# Patient Record
Sex: Female | Born: 1970 | Race: White | Hispanic: No | Marital: Married | Smoking: Never smoker
Health system: Southern US, Community
[De-identification: ages and names within clinical notes are randomized; demographics above are authoritative.]

## PROBLEM LIST (undated history)

## (undated) DIAGNOSIS — F419 Anxiety disorder, unspecified: Secondary | ICD-10-CM

## (undated) DIAGNOSIS — Q250 Patent ductus arteriosus: Secondary | ICD-10-CM

## (undated) HISTORY — PX: NO PAST SURGERIES: SHX2092

---

## 2009-01-18 ENCOUNTER — Ambulatory Visit: Payer: Self-pay | Admitting: Internal Medicine

## 2009-08-11 ENCOUNTER — Ambulatory Visit: Payer: Self-pay | Admitting: Family Medicine

## 2011-02-23 ENCOUNTER — Ambulatory Visit: Payer: Self-pay

## 2014-07-24 ENCOUNTER — Ambulatory Visit: Payer: Self-pay | Admitting: Family Medicine

## 2014-07-27 ENCOUNTER — Ambulatory Visit: Payer: Self-pay | Admitting: Family Medicine

## 2014-07-30 ENCOUNTER — Ambulatory Visit: Payer: Self-pay | Admitting: Physician Assistant

## 2014-12-17 ENCOUNTER — Other Ambulatory Visit: Payer: Self-pay | Admitting: Family Medicine

## 2014-12-17 DIAGNOSIS — Z1231 Encounter for screening mammogram for malignant neoplasm of breast: Secondary | ICD-10-CM

## 2014-12-24 ENCOUNTER — Ambulatory Visit: Payer: Self-pay

## 2015-01-02 ENCOUNTER — Ambulatory Visit
Admission: RE | Admit: 2015-01-02 | Discharge: 2015-01-02 | Disposition: A | Discharge status: Home / Self Care | Payer: Managed Care, Other (non HMO) | Source: Ambulatory Visit | Attending: Family Medicine | Admitting: Family Medicine

## 2015-01-02 DIAGNOSIS — Z1231 Encounter for screening mammogram for malignant neoplasm of breast: Secondary | ICD-10-CM | POA: Insufficient documentation

## 2015-03-13 ENCOUNTER — Encounter: Payer: Self-pay | Admitting: Emergency Medicine

## 2015-03-13 ENCOUNTER — Ambulatory Visit
Admission: EM | Admit: 2015-03-13 | Discharge: 2015-03-13 | Disposition: A | Discharge status: Home / Self Care | Payer: Managed Care, Other (non HMO) | Attending: Family Medicine | Admitting: Family Medicine

## 2015-03-13 DIAGNOSIS — T148XXA Other injury of unspecified body region, initial encounter: Secondary | ICD-10-CM

## 2015-03-13 DIAGNOSIS — T148 Other injury of unspecified body region: Secondary | ICD-10-CM | POA: Diagnosis not present

## 2015-03-13 MED ORDER — CYCLOBENZAPRINE HCL 5 MG PO TABS: 5.0000 mg | ORAL_TABLET | Freq: Three times a day (TID) | ORAL | Status: DC | PRN

## 2015-03-13 MED ORDER — IBUPROFEN 800 MG PO TABS: 800.0000 mg | ORAL_TABLET | Freq: Three times a day (TID) | ORAL | Status: DC | PRN
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
## 2016-09-01 ENCOUNTER — Other Ambulatory Visit: Payer: Self-pay | Admitting: Family Medicine

## 2016-09-01 DIAGNOSIS — Z1239 Encounter for other screening for malignant neoplasm of breast: Secondary | ICD-10-CM

## 2017-07-23 ENCOUNTER — Ambulatory Visit
Admission: EM | Admit: 2017-07-23 | Discharge: 2017-07-23 | Disposition: A | Discharge status: Home / Self Care | Payer: Managed Care, Other (non HMO) | Attending: Emergency Medicine | Admitting: Emergency Medicine

## 2017-07-23 ENCOUNTER — Other Ambulatory Visit: Payer: Self-pay

## 2017-07-23 DIAGNOSIS — J019 Acute sinusitis, unspecified: Secondary | ICD-10-CM

## 2017-07-23 MED ORDER — IBUPROFEN 600 MG PO TABS: 600.0000 mg | ORAL_TABLET | Freq: Four times a day (QID) | ORAL | 0 refills | Status: DC | PRN

## 2017-07-23 MED ORDER — FLUTICASONE PROPIONATE 50 MCG/ACT NA SUSP: 2.0000 | Freq: Every day | NASAL | 0 refills | Status: DC

## 2017-07-23 MED ORDER — AMOXICILLIN-POT CLAVULANATE 875-125 MG PO TABS: 1.0000 | ORAL_TABLET | Freq: Two times a day (BID) | ORAL | 0 refills | Status: DC

## 2017-07-23 NOTE — ED Triage Notes (Signed)
Patient complains of cough and congestion with fever that started one week ago. Patient reports that symptoms worsened on Wednesday and have remained constant.

## 2017-07-23 NOTE — Discharge Instructions (Signed)
continue the phenylephrine , start regular Mucinex, saline nasal irrigation with a Lloyd HugerNeil med sinus rinse as often as you want, Flonase, Augmentin, ibuprofen 600 mg to take with 1 g of Tylenol 3-4 times a day as needed.  follow-up with your primary care physician as needed.

## 2017-07-23 NOTE — ED Provider Notes (Signed)
HPI  SUBJECTIVE:  Madison Preston is a 47 y.o. female who presents with nasal congestion which is now getting thick, headache, fevers starting yesterday.  T-max 100.  Has been sick for 6 days.  She reports sinus pain and pressure, states her upper teeth hurt.  She reports bilateral ear popping but denies ear pain, change in hearing, otorrhea.  She reports a sore throat secondary to postnasal drip.  She reports a cough productive of the same material as her nasal congestion, chest congestion and states that she is wheezing in the morning.  States that she is sleeping at night okay without waking up coughing.  She denies shortness of breath, chest pain, dyspnea on exertion.  She tried Sudafed PE, ibuprofen 400 mg every 6 hours with improvement of symptoms.  Symptoms are worse with bending forward, lying down.  She has a past medical history of seasonal allergies, PFO, anxiety.  No history of diabetes, hypertension, pulmonary disease.  She is not a smoker.  She has an IUD and denies the possibility of being pregnant.  PMD: Dr. Rivka SaferBraunstein.   History reviewed. No pertinent past medical history.  Past Surgical History:  Procedure Laterality Date  . NO PAST SURGERIES      Family History  Problem Relation Age of Onset  . Breast cancer Mother   . Diabetes Father     Social History   Tobacco Use  . Smoking status: Never Smoker  . Smokeless tobacco: Never Used  Substance Use Topics  . Alcohol use: Yes    Alcohol/week: 0.6 oz    Types: 1 Glasses of wine per week    Comment: rare  . Drug use: No    No current facility-administered medications for this encounter.   Current Outpatient Medications:  .  aspirin 81 MG tablet, Take 81 mg by mouth daily., Disp: , Rfl:  .  sertraline (ZOLOFT) 100 MG tablet, Take 100 mg by mouth daily., Disp: , Rfl:  .  amoxicillin-clavulanate (AUGMENTIN) 875-125 MG tablet, Take 1 tablet by mouth 2 (two) times daily. X 7 days, Disp: 14 tablet, Rfl: 0 .   fluticasone (FLONASE) 50 MCG/ACT nasal spray, Place 2 sprays into both nostrils daily., Disp: 16 g, Rfl: 0 .  ibuprofen (ADVIL,MOTRIN) 600 MG tablet, Take 1 tablet (600 mg total) by mouth every 6 (six) hours as needed., Disp: 30 tablet, Rfl: 0  Allergies  Allergen Reactions  . Sulfur      ROS  As noted in HPI.   Physical Exam  BP 133/89 (BP Location: Left Arm)   Pulse (!) 107   Temp 99.2 F (37.3 C) (Oral) Comment: ibuprofen taken at 7am  Resp 18   Ht 5\' 1"  (1.549 m)   Wt 230 lb (104.3 kg)   SpO2 96%   BMI 43.46 kg/m   Constitutional: Well developed, well nourished, no acute distress Eyes:  EOMI, conjunctiva normal bilaterally HENT: Normocephalic, atraumatic,mucus membranes moist.  TMs normal bilaterally.  Positive purulent nasal congestion.  Erythematous, swollen turbinates.  No maxillary or frontal sinus tenderness.  Normal oropharynx.  Uvula midline. Respiratory: Normal inspiratory effort, lungs clear bilaterally  cardiovascular: Mild tachycardia GI: nondistended skin: No rash, skin intact Musculoskeletal: no deformities Neurologic: Alert & oriented x 3, no focal neuro deficits Psychiatric: Speech and behavior appropriate   ED Course   Medications - No data to display  No orders of the defined types were placed in this encounter.   No results found for this or any previous  visit (from the past 24 hour(s)). No results found.  ED Clinical Impression  Acute non-recurrent sinusitis, unspecified location   ED Assessment/Plan  Presentation consistent with a sinusitis, given the fact that she is starting to have fevers 6 days into the illness, concern for secondary bacterial infection.  She will continue the phenylephrine as she states that the Sudafed gives her palpitations, start regular Mucinex, saline nasal irrigation with a Lloyd Huger med sinus rinse, Flonase, Augmentin, ibuprofen 600 mg to take with 1 g of Tylenol 3-4 times a day as needed.  She will follow-up with  her primary care physician as needed.   Meds ordered this encounter  Medications  . fluticasone (FLONASE) 50 MCG/ACT nasal spray    Sig: Place 2 sprays into both nostrils daily.    Dispense:  16 g    Refill:  0  . amoxicillin-clavulanate (AUGMENTIN) 875-125 MG tablet    Sig: Take 1 tablet by mouth 2 (two) times daily. X 7 days    Dispense:  14 tablet    Refill:  0  . ibuprofen (ADVIL,MOTRIN) 600 MG tablet    Sig: Take 1 tablet (600 mg total) by mouth every 6 (six) hours as needed.    Dispense:  30 tablet    Refill:  0    *This clinic note was created using Scientist, clinical (histocompatibility and immunogenetics). Therefore, there may be occasional mistakes despite careful proofreading.   ?   Domenick Gong, MD 07/23/17 2024

## 2017-08-09 ENCOUNTER — Encounter: Payer: Self-pay | Admitting: *Deleted

## 2017-08-09 ENCOUNTER — Ambulatory Visit (INDEPENDENT_AMBULATORY_CARE_PROVIDER_SITE_OTHER): Payer: Managed Care, Other (non HMO)

## 2017-08-09 ENCOUNTER — Ambulatory Visit
Admission: EM | Admit: 2017-08-09 | Discharge: 2017-08-09 | Disposition: A | Discharge status: Home / Self Care | Payer: Managed Care, Other (non HMO) | Attending: Family Medicine | Admitting: Family Medicine

## 2017-08-09 DIAGNOSIS — J111 Influenza due to unidentified influenza virus with other respiratory manifestations: Secondary | ICD-10-CM | POA: Diagnosis not present

## 2017-08-09 HISTORY — DX: Patent ductus arteriosus: Q25.0

## 2017-08-09 LAB — RAPID STREP SCREEN (MED CTR MEBANE ONLY): STREPTOCOCCUS, GROUP A SCREEN (DIRECT): NEGATIVE

## 2017-08-09 LAB — RAPID INFLUENZA A&B ANTIGENS (ARMC ONLY): INFLUENZA B (ARMC): NEGATIVE

## 2017-08-09 LAB — RAPID INFLUENZA A&B ANTIGENS: Influenza A (ARMC): POSITIVE — AB

## 2017-08-09 MED ORDER — HYDROCOD POLST-CPM POLST ER 10-8 MG/5ML PO SUER: 5.0000 mL | Freq: Two times a day (BID) | ORAL | 0 refills | Status: DC | PRN

## 2017-08-09 MED ORDER — ACETAMINOPHEN 500 MG PO TABS
1000.0000 mg | ORAL_TABLET | Freq: Once | ORAL | Status: AC
  Administered 2017-08-09: 1000 mg via ORAL

## 2017-08-09 MED ORDER — OSELTAMIVIR PHOSPHATE 75 MG PO CAPS: 75.0000 mg | ORAL_CAPSULE | Freq: Two times a day (BID) | ORAL | 0 refills | Status: DC

## 2017-08-09 NOTE — ED Provider Notes (Signed)
[facility]    CSN: [id number] Arrival date & time: 08/09/17  0808  History   Chief Complaint Chief Complaint  Patient presents with  . Cough  . Fever  . Sore Throat   HPI  47 year old female presents with the above complaints.  Patient states that she has not been feeling well since she was diagnosed with a sinus infection on 15 February.  She states that she is completed her course of antibiotic.  She continues to feel not feel well.  On Friday she developed severe cough.  Saturday developed body aches and yesterday developed fever.  She is febrile today.  Her predominant concern is the cough which she states is very severe.  Cough is productive of yellow sputum.  She reports associated sore throat.  No known exacerbating or relieving factors.  No other associated symptoms.  No other complaints.  Past Medical History:  Diagnosis Date  . Patent ductus arteriosus    Past Surgical History:  Procedure Laterality Date  . NO PAST SURGERIES      OB History    No data available     Home Medications    Prior to Admission medications   Medication Sig Start Date End Date Taking? Authorizing Provider  aspirin 81 MG tablet Take 81 mg by mouth daily.   Yes [provider]  fluticasone (FLONASE) 50 MCG/ACT nasal spray Place 2 sprays into both nostrils daily. 07/23/17  Yes Domenick Gong, MD  ibuprofen (ADVIL,MOTRIN) 600 MG tablet Take 1 tablet (600 mg total) by mouth every 6 (six) hours as needed. 07/23/17  Yes Domenick Gong, MD  sertraline (ZOLOFT) 100 MG tablet Take 100 mg by mouth daily.   Yes [provider]  chlorpheniramine-HYDROcodone (TUSSIONEX PENNKINETIC ER) 10-8 MG/5ML SUER Take 5 mLs by mouth every 12 (twelve) hours as needed. 08/09/17   Tommie Sams, DO  oseltamivir (TAMIFLU) 75 MG capsule Take 1 capsule (75 mg total) by mouth every 12 (twelve) hours. 08/09/17   Tommie Sams, DO   Family History Family History  Problem Relation Age of  Onset  . Breast cancer Mother   . Diabetes Father    Social History Social History   Tobacco Use  . Smoking status: Never Smoker  . Smokeless tobacco: Never Used  Substance Use Topics  . Alcohol use: Yes    Alcohol/week: 0.6 oz    Types: 1 Glasses of wine per week    Comment: rare  . Drug use: No   Allergies   Sulfur   Review of Systems Review of Systems  Constitutional: Positive for fever.  HENT: Positive for sore throat.   Respiratory: Positive for cough.   Musculoskeletal:       Body aches.   Physical Exam Triage Vital Signs ED Triage Vitals  Enc Vitals Group     BP 08/09/17 0826 140/83     Pulse Rate 08/09/17 0826 (!) 115     Resp 08/09/17 0826 16     Temp 08/09/17 0826 (!) 101.6 F (38.7 C)     Temp Source 08/09/17 0826 Oral     SpO2 08/09/17 0826 97 %     Weight 08/09/17 0829 230 lb (104.3 kg)     Height 08/09/17 0829 5\' 2"  (1.575 m)     Head Circumference --      Peak Flow --      Pain Score --      Pain Loc --      Pain Edu? --  Excl. in GC? --    Updated Vital Signs BP 140/83 (BP Location: Left Arm)   Pulse (!) 115   Temp (!) 101.6 F (38.7 C) (Oral)   Resp 16   Ht 5\' 2"  (1.575 m)   Wt 230 lb (104.3 kg)   SpO2 97%   BMI 42.07 kg/m    Physical Exam  Constitutional: She is oriented to person, place, and time. She appears well-developed. No distress.  HENT:  Head: Normocephalic and atraumatic.  Mouth/Throat: Oropharynx is clear and moist.  Cardiovascular: Regular rhythm.  Tachycardic.  Pulmonary/Chest: Effort normal.  Right basilar crackles noted.  Neurological: She is alert and oriented to person, place, and time.  Psychiatric: She has a normal mood and affect. Her behavior is normal.  Nursing note and vitals reviewed.  UC Treatments / Results  Labs (all labs ordered are listed, but only abnormal results are displayed) Labs Reviewed  RAPID INFLUENZA A&B ANTIGENS ([facility] ONLY) - Abnormal; Notable for the following components:       Result Value   Influenza A ([facility]) POSITIVE (*)    All other components within normal limits  RAPID STREP SCREEN (NOT AT [facility])  CULTURE, GROUP A STREP [facility])    EKG  EKG Interpretation None       Radiology Dg Chest 2 View  Result Date: 08/09/2017 CLINICAL DATA:  Three days of body aches, sinus infection, and sore throat with onset of productive cough and fever and chest tightness 3 days ago. 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 EXAM: CHEST  2 VIEW COMPARISON:  Chest x-ray of July 27, 2014 FINDINGS: The lungs are adequately inflated and clear. The heart and pulmonary vascularity are normal. The mediastinum is normal in width. There is no pleural effusion. The bony thorax is unremarkable. IMPRESSION: There is no active cardiopulmonary disease. Electronically Signed   By: David  [place] M.D.   On: 08/09/2017 09:11    Procedures Procedures (including critical care time)  Medications Ordered in UC Medications  acetaminophen (TYLENOL) tablet 1,000 mg (1,000 mg Oral Given 08/09/17 0836)     Initial Impression / Assessment and Plan / UC Course  I have reviewed the triage vital signs and the nursing notes.  Pertinent labs & imaging results that were available during my care of the patient were reviewed by me and considered in my medical decision making (see chart for details).    47 year old female presents with influenza.  Chest x-ray was negative.  Treating with Tamiflu and Tussionex.  Out of work until later this week.  Final Clinical Impressions(s) / UC Diagnoses   Final diagnoses:  Influenza    ED Discharge Orders        Ordered    chlorpheniramine-HYDROcodone (TUSSIONEX PENNKINETIC ER) 10-8 MG/5ML SUER  Every 12 hours PRN     08/09/17 0919    oseltamivir (TAMIFLU) 75 MG capsule  Every 12 hours     08/09/17 0919     Controlled Substance Prescriptions [place] Controlled Substance Registry consulted? Not Applicable   Tommie SamsCook, Maisyn Nouri G, [place] 08/09/17 [id number]

## 2017-08-09 NOTE — ED Triage Notes (Signed)
Seen here 3 weeks ago and dx with "sinus infection". Got partial relief with amoxicillin but various symptoms persisted since. Last Friday onset of productive cough-yellow, and fever on Sunday. Also. Pt c/o sore throat.

## 2017-08-11 LAB — CULTURE, GROUP A STREP (THRC)

## 2017-08-12 ENCOUNTER — Ambulatory Visit (INDEPENDENT_AMBULATORY_CARE_PROVIDER_SITE_OTHER): Payer: Managed Care, Other (non HMO)

## 2017-08-12 ENCOUNTER — Ambulatory Visit
Admission: EM | Admit: 2017-08-12 | Discharge: 2017-08-12 | Disposition: A | Discharge status: Home / Self Care | Payer: Managed Care, Other (non HMO) | Attending: Family Medicine | Admitting: Family Medicine

## 2017-08-12 ENCOUNTER — Other Ambulatory Visit: Payer: Self-pay

## 2017-08-12 DIAGNOSIS — R05 Cough: Secondary | ICD-10-CM | POA: Diagnosis not present

## 2017-08-12 DIAGNOSIS — R0789 Other chest pain: Secondary | ICD-10-CM

## 2017-08-12 DIAGNOSIS — R059 Cough, unspecified: Secondary | ICD-10-CM

## 2017-08-12 MED ORDER — AZITHROMYCIN 250 MG PO TABS: 250.0000 mg | ORAL_TABLET | Freq: Every day | ORAL | 0 refills | Status: DC

## 2017-08-12 NOTE — Discharge Instructions (Signed)
Start the antibiotic.  Continue the other meds.  Take care  Dr. Adriana Simasook

## 2017-08-12 NOTE — ED Provider Notes (Signed)
[facility]    CSN: [id number] Arrival date & time: 08/12/17  1422  History   Chief Complaint Chief Complaint  Patient presents with  . Influenza   HPI  47 year old female presents with worsening symptoms after recently being seen for the flu.  Patient was seen by me on 3/4.  She was diagnosed with influenza.  She was treated with Tussionex and Tamiflu.  Chest x-ray was negative.  Patient returns today reporting worsening cough, shortness of breath, chest tightness.  She states that she is concerned given her worsening symptoms.  Her pain is currently 2/10 in severity.  No known exacerbating or relieving factors.  No other associated symptoms.  No other complaints.  Social History Social History   Tobacco Use  . Smoking status: Never Smoker  . Smokeless tobacco: Never Used  Substance Use Topics  . Alcohol use: Yes    Alcohol/week: 0.6 oz    Types: 1 Glasses of wine per week    Comment: rare  . Drug use: No    Allergies   Sulfa antibiotics  Review of Systems Review of Systems  Constitutional: Positive for fatigue.  Respiratory: Positive for cough, chest tightness and shortness of breath.    Physical Exam Triage Vital Signs ED Triage Vitals  Enc Vitals Group     BP 08/12/17 1435 (!) 111/98     Pulse Rate 08/12/17 1435 95     Resp 08/12/17 1435 18     Temp 08/12/17 1435 98.9 F (37.2 C)     Temp Source 08/12/17 1435 Oral     SpO2 08/12/17 1435 94 %     Weight 08/12/17 1433 230 lb (104.3 kg)     Height 08/12/17 1433 5\' 2"  (1.575 m)     Head Circumference --      Peak Flow --      Pain Score 08/12/17 1433 5     Pain Loc --      Pain Edu? --      Excl. in GC? --    Updated Vital Signs BP (!) 111/98 (BP Location: Left Arm)   Pulse 95   Temp 98.9 F (37.2 C) (Oral)   Resp 18   Ht 5\' 2"  (1.575 m)   Wt 230 lb (104.3 kg)   SpO2 94%   BMI 42.07 kg/m    Physical Exam  Constitutional: She is oriented to person, place, and time. She appears  well-developed. No distress.  HENT:  Head: Normocephalic and atraumatic.  Eyes: Conjunctivae are normal.  Cardiovascular: Normal rate and regular rhythm.  Pulmonary/Chest: Effort normal.  Right basilar crackles.  Neurological: She is alert and oriented to person, place, and time.  Psychiatric: She has a normal mood and affect. Her behavior is normal.  Nursing note and vitals reviewed.  UC Treatments / Results  Labs (all labs ordered are listed, but only abnormal results are displayed) Labs Reviewed - No data to display  EKG  EKG Interpretation None       Radiology Dg Chest 2 View  Result Date: 08/12/2017 CLINICAL DATA:  Cough and chest tightness for the past 2 weeks. History of previous episodes of pneumonia. Never smoked. EXAM: CHEST - 2 VIEW COMPARISON:  Chest x-ray of August 09, 2017 FINDINGS: The lungs are adequately inflated and clear. The heart and pulmonary vascularity are normal. The mediastinum is normal in width. There is no pleural effusion. The trachea is midline. The bony thorax exhibits no acute abnormality. IMPRESSION: There is no  pneumonia nor other acute cardiopulmonary abnormality. Electronically Signed   By: David  [place] M.D.   On: 08/12/2017 15:01    Procedures Procedures (including critical care time)  Medications Ordered in UC Medications - No data to display   Initial Impression / Assessment and Plan / UC Course  I have reviewed the triage vital signs and the nursing notes.  Pertinent labs & imaging results that were available during my care of the patient were reviewed by me and considered in my medical decision making (see chart for details).     47 year old female presents with cough, chest tightness, shortness of breath after recently being diagnosed with influenza.  Repeat chest x-ray was negative.  Placing on azithromycin to cover for secondary bacterial infection.  Continue Tamiflu and Tussionex.  Supportive care.  Final Clinical Impressions(s)  / UC Diagnoses   Final diagnoses:  Cough    ED Discharge Orders        Ordered    azithromycin (ZITHROMAX) 250 MG tablet  Daily     08/12/17 1512     Controlled Substance Prescriptions [place] Controlled Substance Registry consulted? Not Applicable   Tommie SamsCook, Jilian West G, DO 08/12/17 1558

## 2017-08-12 NOTE — ED Triage Notes (Signed)
Patient complains of cough, shortness of breath, tightness with breathing and pain with exhale and a "squeaky" noise. Patient reports that she was seen here on Monday and tested positive for Influenza A. Patient has been on Tamiflu and Tussionex. Patient states that symptoms worsened Tuesday pm.

## 2017-08-22 ENCOUNTER — Ambulatory Visit
Admission: EM | Admit: 2017-08-22 | Discharge: 2017-08-22 | Disposition: A | Discharge status: Home / Self Care | Payer: Managed Care, Other (non HMO) | Attending: Family Medicine | Admitting: Family Medicine

## 2017-08-22 ENCOUNTER — Other Ambulatory Visit: Payer: Self-pay

## 2017-08-22 DIAGNOSIS — J988 Other specified respiratory disorders: Secondary | ICD-10-CM | POA: Diagnosis not present

## 2017-08-22 DIAGNOSIS — R509 Fever, unspecified: Secondary | ICD-10-CM | POA: Diagnosis not present

## 2017-08-22 DIAGNOSIS — G9331 Postviral fatigue syndrome: Secondary | ICD-10-CM

## 2017-08-22 DIAGNOSIS — R05 Cough: Secondary | ICD-10-CM

## 2017-08-22 DIAGNOSIS — G933 Postviral fatigue syndrome: Secondary | ICD-10-CM | POA: Diagnosis not present

## 2017-08-22 HISTORY — DX: Anxiety disorder, unspecified: F41.9

## 2017-08-22 MED ORDER — LEVOFLOXACIN 500 MG PO TABS: 500.0000 mg | ORAL_TABLET | Freq: Every day | ORAL | 0 refills | Status: DC

## 2017-08-22 MED ORDER — HYDROCOD POLST-CPM POLST ER 10-8 MG/5ML PO SUER: 5.0000 mL | Freq: Two times a day (BID) | ORAL | 0 refills | Status: DC | PRN

## 2017-08-22 NOTE — Discharge Instructions (Signed)
Rest, fluids.  Take the medications as prescribed.  Please see your doctor in the near future. You may need to see a lung specialist if you continue to have symptoms.  Take care  Dr. Adriana Simasook

## 2017-08-22 NOTE — ED Provider Notes (Signed)
[facility]  CSN: [id number] Arrival date & time: 08/22/17  0809  History   Chief Complaint Chief Complaint  Patient presents with  . Cough   HPI  47 year old female presents with worsening cough, return of fever and congestion.  Patient has been here 3 times in the past month.  Today is her fourth visit.  She has been diagnosed with sinusitis and treated.  She is also been seen for influenza.  She continues to not feel well.  She states that she felt like she was improving until this Thursday when she developed a worsening cough.  She states that as of yesterday she developed fever.  This morning it was 101.  Most troublesome symptom is the cough.  She feels poorly.  No known exacerbating relieving factors.  No other associated symptoms.  No other complaints.  Past Medical History:  Diagnosis Date  . Anxiety   . Patent ductus arteriosus    Past Surgical History:  Procedure Laterality Date  . NO PAST SURGERIES     OB History    No data available     Home Medications    Prior to Admission medications   Medication Sig Start Date End Date Taking? Authorizing Provider  levonorgestrel (MIRENA) 20 MCG/24HR IUD 1 each by Intrauterine route once.   Yes [provider]  aspirin 81 MG tablet Take 81 mg by mouth daily.    [provider]  chlorpheniramine-HYDROcodone (TUSSIONEX PENNKINETIC ER) 10-8 MG/5ML SUER Take 5 mLs by mouth every 12 (twelve) hours as needed. 08/22/17   Tommie Samsook, Nyaira Hodgens G, DO  fluticasone (FLONASE) 50 MCG/ACT nasal spray Place 2 sprays into both nostrils daily. 07/23/17   Domenick GongMortenson, Ashley, MD  ibuprofen (ADVIL,MOTRIN) 600 MG tablet Take 1 tablet (600 mg total) by mouth every 6 (six) hours as needed. 07/23/17   Domenick GongMortenson, Ashley, MD  levofloxacin (LEVAQUIN) 500 MG tablet Take 1 tablet (500 mg total) by mouth daily. 08/22/17   Tommie Samsook, [place] Retana G, DO  sertraline (ZOLOFT) 100 MG tablet Take 100 mg by mouth daily.    [provider]    Family  History Family History  Problem Relation Age of Onset  . Breast cancer Mother   . Diabetes Father     Social History Social History   Tobacco Use  . Smoking status: Never Smoker  . Smokeless tobacco: Never Used  Substance Use Topics  . Alcohol use: Yes    Alcohol/week: 0.6 oz    Types: 1 Glasses of wine per week    Comment: rare  . Drug use: No     Allergies   Sulfa antibiotics   Review of Systems Review of Systems  Constitutional: Positive for fever.  HENT: Positive for congestion.   Respiratory: Positive for cough.     Physical Exam Triage Vital Signs ED Triage Vitals  Enc Vitals Group     BP 08/22/17 0816 131/74     Pulse Rate 08/22/17 0816 (!) 118     Resp 08/22/17 0816 20     Temp 08/22/17 0816 98.9 F (37.2 C)     Temp Source 08/22/17 0816 Oral     SpO2 08/22/17 0816 97 %     Weight 08/22/17 0818 230 lb (104.3 kg)     Height 08/22/17 0818 5\' 2"  (1.575 m)     Head Circumference --      Peak Flow --      Pain Score 08/22/17 0818 3     Pain Loc --  Pain Edu? --      Excl. in GC? --    Updated Vital Signs BP 131/74 (BP Location: Left Arm)   Pulse (!) 118   Temp 98.9 F (37.2 C) (Oral)   Resp 20   Ht 5\' 2"  (1.575 m)   Wt 230 lb (104.3 kg)   SpO2 97%   BMI 42.07 kg/m    Physical Exam  Constitutional: She is oriented to person, place, and time. She appears well-developed. No distress.  HENT:  Head: Normocephalic and atraumatic.  Mouth/Throat: Oropharynx is clear and moist.  Cardiovascular: Regular rhythm. Tachycardia present.  Pulmonary/Chest: Effort normal and breath sounds normal. She has no wheezes. She has no rales.  Neurological: She is alert and oriented to person, place, and time.  Psychiatric: She has a normal mood and affect. Her behavior is normal.  Nursing note and vitals reviewed.  UC Treatments / Results  Labs (all labs ordered are listed, but only abnormal results are displayed) Labs Reviewed - No data to display  EKG   EKG Interpretation None       Radiology No results found.  Procedures Procedures (including critical care time)  Medications Ordered in UC Medications - No data to display   Initial Impression / Assessment and Plan / UC Course  I have reviewed the triage vital signs and the nursing notes.  Pertinent labs & imaging results that were available during my care of the patient were reviewed by me and considered in my medical decision making (see chart for details).     47 year old female presents with return of fever and worsening cough.  I am not sure of the etiology at this time.  She has had 2 chest x-rays which have both been negative.  I will not do a chest x-ray today.  Given persistent symptoms and return of fever, I am treating her empirically with Levaquin and Tussionex.  Final Clinical Impressions(s) / UC Diagnoses   Final diagnoses:  Post-influenza syndrome  Respiratory infection    ED Discharge Orders        Ordered    levofloxacin (LEVAQUIN) 500 MG tablet  Daily     08/22/17 0846    chlorpheniramine-HYDROcodone (TUSSIONEX PENNKINETIC ER) 10-8 MG/5ML SUER  Every 12 hours PRN     08/22/17 0847     Controlled Substance Prescriptions [place] Controlled Substance Registry consulted? Not Applicable   Tommie Sams, [place] 08/22/17 [phone]

## 2017-08-22 NOTE — ED Triage Notes (Signed)
Pt reports "I have been sick off and on since January." Reports she has been seen here several times. Cough started again on Thursday and some congestion/post nasal drip. Low grade fever.

## 2018-09-23 ENCOUNTER — Encounter: Payer: Self-pay | Admitting: Emergency Medicine

## 2018-09-23 ENCOUNTER — Ambulatory Visit
Admission: EM | Admit: 2018-09-23 | Discharge: 2018-09-23 | Disposition: A | Discharge status: Home / Self Care | Payer: 59 | Attending: Family Medicine | Admitting: Family Medicine

## 2018-09-23 ENCOUNTER — Other Ambulatory Visit: Payer: Self-pay

## 2018-09-23 DIAGNOSIS — H6502 Acute serous otitis media, left ear: Secondary | ICD-10-CM

## 2018-09-23 DIAGNOSIS — L03211 Cellulitis of face: Secondary | ICD-10-CM

## 2018-09-23 MED ORDER — AMOXICILLIN-POT CLAVULANATE 875-125 MG PO TABS: 1.0000 | ORAL_TABLET | Freq: Two times a day (BID) | ORAL | 0 refills | Status: DC

## 2018-09-23 NOTE — ED Triage Notes (Signed)
Pt c/o left ear pain. She started about 5 days ago. She states that it has gotten worse each day and this morning her face is swollen and she can not hear out of her ear.

## 2018-09-23 NOTE — Discharge Instructions (Signed)
Warm moist compresses to facial area Tylenol/ibuprofen as needed

## 2018-09-23 NOTE — ED Provider Notes (Addendum)
[facility]    CSN: [id number] Arrival date & time: 09/23/18  0830     History   Chief Complaint Chief Complaint  Patient presents with  . Otalgia    left    HPI Merari Sult Lumbra is a 48 y.o. female.   48 yo female with a 5 days h/o left ear pain and 1 day h/o left sided facial redness, swelling and pain. Denies any fevers, chills.   The history is provided by the patient.    Past Medical History:  Diagnosis Date  . Anxiety   . Patent ductus arteriosus     There are no active problems to display for this patient.   Past Surgical History:  Procedure Laterality Date  . NO PAST SURGERIES      OB History   No obstetric history on file.      Home Medications    Prior to Admission medications   Medication Sig Start Date End Date Taking? Authorizing Provider  aspirin 81 MG tablet Take 81 mg by mouth daily.   Yes [provider]  loratadine (CLARITIN) 10 MG tablet Take 10 mg by mouth daily.   Yes [provider]  sertraline (ZOLOFT) 100 MG tablet Take 100 mg by mouth daily.   Yes [provider]  amoxicillin-clavulanate (AUGMENTIN) 875-125 MG tablet Take 1 tablet by mouth 2 (two) times daily. 09/23/18   Payton Mccallum, MD  chlorpheniramine-HYDROcodone (TUSSIONEX PENNKINETIC ER) 10-8 MG/5ML SUER Take 5 mLs by mouth every 12 (twelve) hours as needed. 08/22/17   Tommie Sams, DO  fluticasone (FLONASE) 50 MCG/ACT nasal spray Place 2 sprays into both nostrils daily. 07/23/17   Domenick Gong, MD  ibuprofen (ADVIL,MOTRIN) 600 MG tablet Take 1 tablet (600 mg total) by mouth every 6 (six) hours as needed. 07/23/17   Domenick Gong, MD  levofloxacin (LEVAQUIN) 500 MG tablet Take 1 tablet (500 mg total) by mouth daily. 08/22/17   Tommie Sams, DO  levonorgestrel (MIRENA) 20 MCG/24HR IUD 1 each by Intrauterine route once.    [provider]    Family History Family History  Problem Relation Age of Onset  . Breast cancer  Mother   . Diabetes Father     Social History Social History   Tobacco Use  . Smoking status: Never Smoker  . Smokeless tobacco: Never Used  Substance Use Topics  . Alcohol use: Yes    Alcohol/week: 1.0 standard drinks    Types: 1 Glasses of wine per week    Comment: rare  . Drug use: No     Allergies   Sulfa antibiotics   Review of Systems Review of Systems   Physical Exam Triage Vital Signs ED Triage Vitals  Enc Vitals Group     BP 09/23/18 0845 111/84     Pulse Rate 09/23/18 0845 97     Resp 09/23/18 0845 18     Temp 09/23/18 0845 98.4 F (36.9 C)     Temp Source 09/23/18 0845 Oral     SpO2 09/23/18 0845 98 %     Weight 09/23/18 0841 235 lb (106.6 kg)     Height 09/23/18 0841 5\' 2"  (1.575 m)     Head Circumference --      Peak Flow --      Pain Score 09/23/18 0841 5     Pain Loc --      Pain Edu? --      Excl. in GC? --  No data found.  Updated Vital Signs BP 111/84 (BP Location: Left Arm)   Pulse 97   Temp 98.4 F (36.9 C) (Oral)   Resp 18   Ht 5\' 2"  (1.575 m)   Wt 106.6 kg   LMP 08/21/2018 (Approximate)   SpO2 98%   BMI 42.98 kg/m   Visual Acuity Right Eye Distance:   Left Eye Distance:   Bilateral Distance:    Right Eye Near:   Left Eye Near:    Bilateral Near:     Physical Exam Vitals signs and nursing note reviewed.  Constitutional:      General: She is not in acute distress.    Appearance: She is not ill-appearing, toxic-appearing or diaphoretic.  HENT:     Left Ear: Tympanic membrane is injected, erythematous and bulging.  Skin:    Comments: Left sided periauricular skin with blanchable erythema, warmth and tenderness to palpation; no abscess or drainage  Neurological:     Mental Status: She is alert.      UC Treatments / Results  Labs (all labs ordered are listed, but only abnormal results are displayed) Labs Reviewed - No data to display  EKG None  Radiology No results found.  Procedures Procedures  (including critical care time)  Medications Ordered in UC Medications - No data to display  Initial Impression / Assessment and Plan / UC Course  I have reviewed the triage vital signs and the nursing notes.  Pertinent labs & imaging results that were available during my care of the patient were reviewed by me and considered in my medical decision making (see chart for details).      Final Clinical Impressions(s) / UC Diagnoses   Final diagnoses:  Acute serous otitis media of left ear, recurrence not specified  Facial cellulitis     Discharge Instructions     Warm moist compresses to facial area Tylenol/ibuprofen as needed    ED Prescriptions    Medication Sig Dispense Auth. Provider   amoxicillin-clavulanate (AUGMENTIN) 875-125 MG tablet Take 1 tablet by mouth 2 (two) times daily. 20 tablet Payton Mccallumonty, Laney Louderback, MD      1. diagnosis reviewed with patient 2. rx as per orders above; reviewed possible side effects, interactions, risks and benefits  3. Recommend supportive treatment as above 4. Follow-up prn if symptoms worsen or don't improve   Controlled Substance Prescriptions [place] Controlled Substance Registry consulted? Not Applicable   Payton Mccallumonty, Tawanna Funk, MD 09/23/18 [phone]    Payton Mccallumonty, Kensey Luepke, MD 09/23/18 [phone]

## 2019-07-15 IMAGING — CR DG CHEST 2V
2 series · 2 of 2 positions shown · non-contrast
Comparison: Chest x-ray of July 27, 2014

CLINICAL DATA: Three days of body aches, sinus infection, and sore
throat with onset of productive cough and fever and chest tightness
3 days ago. History of a patent ductus arteriosus at birth.

EXAM:
CHEST  2 VIEW

[chest pa]
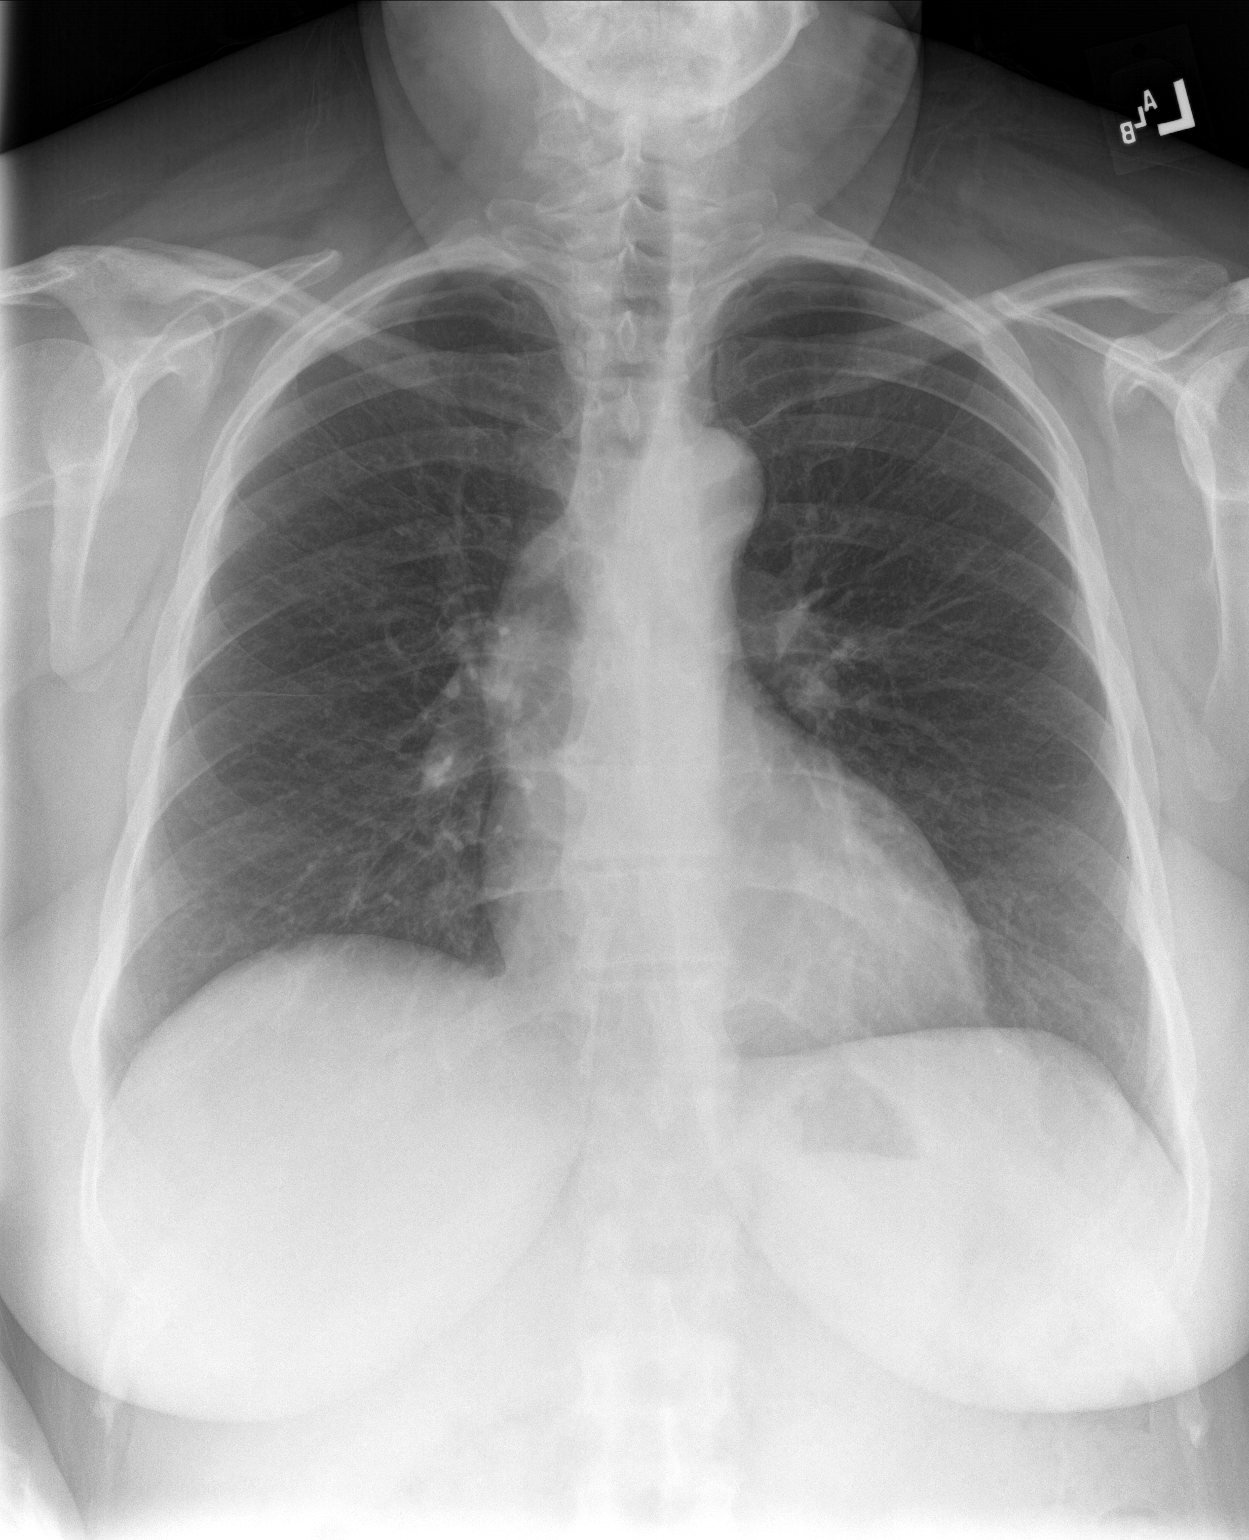

[chest lat]
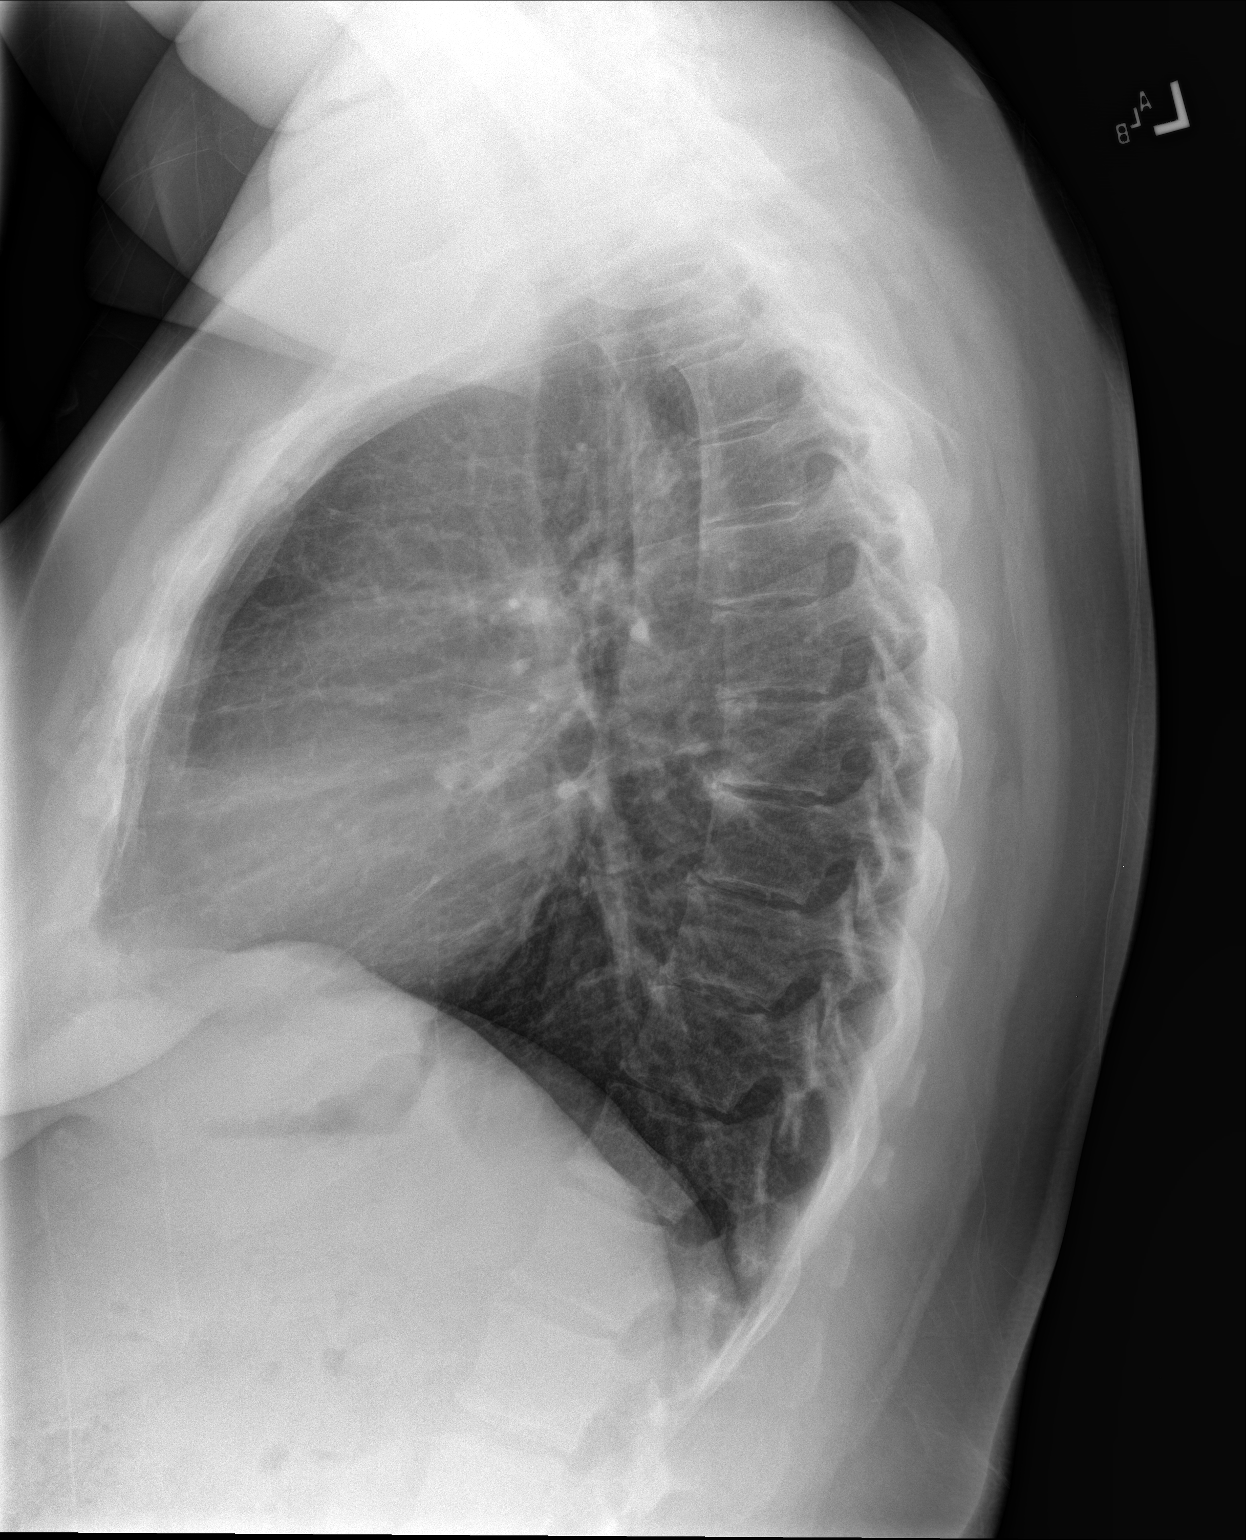

[2 of 2 positions shown; findings below may reference images not displayed]

FINDINGS: The lungs are adequately inflated and clear. The heart and pulmonary
vascularity are normal. The mediastinum is normal in width. There is
no pleural effusion. The bony thorax is unremarkable.
IMPRESSION: There is no active cardiopulmonary disease.

## 2019-11-17 ENCOUNTER — Other Ambulatory Visit: Payer: Self-pay | Admitting: Family Medicine

## 2019-11-17 DIAGNOSIS — Z1231 Encounter for screening mammogram for malignant neoplasm of breast: Secondary | ICD-10-CM

## 2019-11-22 ENCOUNTER — Other Ambulatory Visit: Payer: Self-pay

## 2019-11-22 ENCOUNTER — Ambulatory Visit
Admission: RE | Admit: 2019-11-22 | Discharge: 2019-11-22 | Disposition: A | Discharge status: Home / Self Care | Payer: 59 | Source: Ambulatory Visit | Attending: Family Medicine | Admitting: Family Medicine

## 2019-11-22 DIAGNOSIS — Z1231 Encounter for screening mammogram for malignant neoplasm of breast: Secondary | ICD-10-CM | POA: Diagnosis present

## 2020-05-18 ENCOUNTER — Other Ambulatory Visit: Payer: Self-pay

## 2020-05-18 ENCOUNTER — Ambulatory Visit
Admission: RE | Admit: 2020-05-18 | Discharge: 2020-05-18 | Disposition: A | Discharge status: Home / Self Care | Payer: BC Managed Care – PPO | Source: Ambulatory Visit | Attending: Physician Assistant | Admitting: Physician Assistant

## 2020-05-18 VITALS — BP 120/69 | HR 98 | Temp 98.3°F | Resp 18 | Ht 62.0 in | Wt 240.0 lb

## 2020-05-18 DIAGNOSIS — F419 Anxiety disorder, unspecified: Secondary | ICD-10-CM | POA: Diagnosis not present

## 2020-05-18 DIAGNOSIS — Z79899 Other long term (current) drug therapy: Secondary | ICD-10-CM | POA: Insufficient documentation

## 2020-05-18 DIAGNOSIS — Z20822 Contact with and (suspected) exposure to covid-19: Secondary | ICD-10-CM | POA: Insufficient documentation

## 2020-05-18 DIAGNOSIS — J0100 Acute maxillary sinusitis, unspecified: Secondary | ICD-10-CM

## 2020-05-18 DIAGNOSIS — H9203 Otalgia, bilateral: Secondary | ICD-10-CM | POA: Diagnosis not present

## 2020-05-18 LAB — RESP PANEL BY RT-PCR (FLU A&B, COVID) ARPGX2
Influenza A by PCR: NEGATIVE
Influenza B by PCR: NEGATIVE
SARS Coronavirus 2 by RT PCR: NEGATIVE

## 2020-05-18 MED ORDER — AMOXICILLIN-POT CLAVULANATE 875-125 MG PO TABS: 1.0000 | ORAL_TABLET | Freq: Two times a day (BID) | ORAL | 0 refills | Status: AC

## 2020-05-18 NOTE — ED Provider Notes (Signed)
[facility]    CSN: [id number] Arrival date & time: 05/18/20  1248      History   Chief Complaint Chief Complaint  Patient presents with  . Appointment  . Otalgia  . Cough  . Sore Throat         HPI Madison Preston is a 49 y.o. female.   49 year old female presents with sore throat, headache, nasal congestion and cough for the past 8 days. Now experiencing more sinus pressure and bilateral ear pain over the past 3 to 4 days and getting worse. Denies any distinct fever or GI symptoms. Took home COVID 19 test which was negative. Has been vaccinated against COVID 19. Has also used Sinu-Rinse and Ibuprofen with minimal relief. Other chronic health issues include anxiety and environmental allergies and currently on Zoloft, aspirin and Claritin daily.   The history is provided by the patient.    Past Medical History:  Diagnosis Date  . Anxiety   . Patent ductus arteriosus     There are no problems to display for this patient.   Past Surgical History:  Procedure Laterality Date  . NO PAST SURGERIES      OB History   No obstetric history on file.      Home Medications    Prior to Admission medications   Medication Sig Start Date End Date Taking? Authorizing Provider  aspirin 81 MG tablet Take 81 mg by mouth daily.   Yes [provider]  levonorgestrel (MIRENA) 20 MCG/24HR IUD 1 each by Intrauterine route once.   Yes [provider]  loratadine (CLARITIN) 10 MG tablet Take 10 mg by mouth daily.   Yes [provider]  sertraline (ZOLOFT) 100 MG tablet Take 100 mg by mouth daily.   Yes [provider]  amoxicillin-clavulanate (AUGMENTIN) 875-125 MG tablet Take 1 tablet by mouth every 12 (twelve) hours for 7 days. 05/18/20 05/25/20  Sudie Grumbling, NP  fluticasone (FLONASE) 50 MCG/ACT nasal spray Place 2 sprays into both nostrils daily. 07/23/17 05/18/20  Domenick Gong, MD    Family History Family History   Problem Relation Age of Onset  . Breast cancer Mother   . Diabetes Father     Social History Social History   Tobacco Use  . Smoking status: Never Smoker  . Smokeless tobacco: Never Used  Vaping Use  . Vaping Use: Never used  Substance Use Topics  . Alcohol use: Yes    Alcohol/week: 1.0 standard drink    Types: 1 Glasses of wine per week    Comment: rare  . Drug use: No     Allergies   Sulfa antibiotics   Review of Systems Review of Systems  Constitutional: Positive for fatigue. Negative for activity change, appetite change, chills and fever.  HENT: Positive for congestion, ear pain, postnasal drip, sinus pressure, sinus pain and sore throat. Negative for ear discharge, facial swelling, mouth sores, rhinorrhea and trouble swallowing.   Eyes: Negative for pain, discharge, redness and itching.  Respiratory: Positive for cough. Negative for chest tightness, shortness of breath and wheezing.   Gastrointestinal: Negative for nausea and vomiting.  Musculoskeletal: Negative for arthralgias, myalgias, neck pain and neck stiffness.  Skin: Negative for color change and rash.  Allergic/Immunologic: Positive for environmental allergies. Negative for food allergies and immunocompromised state.  Neurological: Positive for headaches. Negative for dizziness, tremors, seizures, syncope, speech difficulty, weakness, light-headedness and numbness.  Hematological: Negative for adenopathy. Does not bruise/bleed easily.  Physical Exam Triage Vital Signs ED Triage Vitals  Enc Vitals Group     BP 05/18/20 1321 120/69     Pulse Rate 05/18/20 1321 98     Resp 05/18/20 1321 18     Temp 05/18/20 1321 98.3 F (36.8 C)     Temp Source 05/18/20 1321 Oral     SpO2 05/18/20 1321 96 %     Weight 05/18/20 1323 240 lb (108.9 kg)     Height 05/18/20 1323 5\' 2"  (1.575 m)     Head Circumference --      Peak Flow --      Pain Score 05/18/20 1323 6     Pain Loc --      Pain Edu? --      Excl.  in GC? --    No data found.  Updated Vital Signs BP 120/69 (BP Location: Left Arm)   Pulse 98   Temp 98.3 F (36.8 C) (Oral)   Resp 18   Ht 5\' 2"  (1.575 m)   Wt 240 lb (108.9 kg)   SpO2 96%   BMI 43.90 kg/m   Visual Acuity Right Eye Distance:   Left Eye Distance:   Bilateral Distance:    Right Eye Near:   Left Eye Near:    Bilateral Near:     Physical Exam Vitals and nursing note reviewed.  Constitutional:      General: She is awake. She is not in acute distress.    Appearance: She is well-developed and well-groomed. She is ill-appearing.     Comments: She is sitting comfortably on the exam table in no acute distress but appears tired and ill.   HENT:     Head: Normocephalic and atraumatic.     Right Ear: Hearing, ear canal and external ear normal. Tympanic membrane is bulging. Tympanic membrane is not injected, perforated or erythematous.     Left Ear: Hearing, ear canal and external ear normal. Tympanic membrane is bulging. Tympanic membrane is not injected, perforated or erythematous.     Nose: Congestion present.     Right Turbinates: Swollen.     Left Turbinates: Swollen.     Right Sinus: Maxillary sinus tenderness present. No frontal sinus tenderness.     Left Sinus: Maxillary sinus tenderness present. No frontal sinus tenderness.     Mouth/Throat:     Lips: Pink.     Mouth: Mucous membranes are moist.     Pharynx: Oropharyngeal exudate and posterior oropharyngeal erythema present. No pharyngeal swelling or uvula swelling.     Comments: Slight yellowish post nasal drainage present.  Eyes:     Extraocular Movements: Extraocular movements intact.     Conjunctiva/sclera: Conjunctivae normal.  Cardiovascular:     Rate and Rhythm: Normal rate and regular rhythm.     Heart sounds: Normal heart sounds. No murmur heard.   Pulmonary:     Effort: Pulmonary effort is normal. No respiratory distress.     Breath sounds: Normal breath sounds and air entry. No decreased  air movement. No decreased breath sounds, wheezing, rhonchi or rales.  Musculoskeletal:     Cervical back: Normal range of motion and neck supple. No rigidity.  Lymphadenopathy:     Cervical: No cervical adenopathy.  Skin:    General: Skin is warm and dry.     Capillary Refill: Capillary refill takes less than 2 seconds.     Findings: No rash.  Neurological:     General: No focal deficit present.  Mental Status: She is alert and oriented to person, place, and time.  Psychiatric:        Mood and Affect: Mood normal.        Behavior: Behavior normal. Behavior is cooperative.        Thought Content: Thought content normal.        Judgment: Judgment normal.      UC Treatments / Results  Labs (all labs ordered are listed, but only abnormal results are displayed) Labs Reviewed  RESP PANEL BY RT-PCR (FLU A&B, COVID) [id number]    EKG   Radiology No results found.  Procedures Procedures (including critical care time)  Medications Ordered in UC Medications - No data to display  Initial Impression / Assessment and Plan / UC Course  I have reviewed the triage vital signs and the nursing notes.  Pertinent labs & imaging results that were available during my care of the patient were reviewed by me and considered in my medical decision making (see chart for details).    Reviewed rapid negative COVID 19 and Influenza test results. Discussed that she probably has a bacterial sinus infection. No ear infection but pain most likely due from fluid and sinus pressure. Will start Augmentin 875mg  twice a day as directed. Continue Sinu-Rinse as directed to help with congestion. Continue Claritin daily. Continue Ibuprofen 600mg  every 8 hours as needed for headache and ear pain. Follow-up in 4 to 5 days if not improving.   Final Clinical Impressions(s) / UC Diagnoses   Final diagnoses:  Acute non-recurrent maxillary sinusitis  Acute ear pain, bilateral     Discharge Instructions      Recommend start Augmentin 875mg  twice a day as directed. Continue Sinus rinse as directed to help with congestion. May continue Ibuprofen 600mg  every 8 hours as needed for headache/ear pain. Follow-up with your PCP in 4 to 5 days if not improving.     ED Prescriptions    Medication Sig Dispense Auth. Provider   amoxicillin-clavulanate (AUGMENTIN) 875-125 MG tablet Take 1 tablet by mouth every 12 (twelve) hours for 7 days. 14 tablet Raiya Stainback, , NP     PDMP not reviewed this encounter.   , NP 05/18/20 2248

## 2020-05-18 NOTE — Discharge Instructions (Addendum)
Recommend start Augmentin 875mg  twice a day as directed. Continue Sinus rinse as directed to help with congestion. May continue Ibuprofen 600mg  every 8 hours as needed for headache/ear pain. Follow-up with your PCP in 4 to 5 days if not improving.

## 2020-05-18 NOTE — ED Triage Notes (Signed)
Patient in today c/o cough, nasal congestion, sore throat x 8 days and bilateral ear pain L>R x 3 days. Patient has been taking Claritin and doing a sinus rinse every morning. Patient has had the covid vaccines. Patient has done 2 at home tests on 05/12/20 and 05/15/20 both negative.

## 2020-05-31 ENCOUNTER — Other Ambulatory Visit: Payer: Self-pay

## 2020-05-31 ENCOUNTER — Ambulatory Visit
Admission: EM | Admit: 2020-05-31 | Discharge: 2020-05-31 | Disposition: A | Discharge status: Home / Self Care | Payer: BC Managed Care – PPO | Attending: Physician Assistant | Admitting: Physician Assistant

## 2020-05-31 ENCOUNTER — Encounter: Payer: Self-pay | Admitting: Emergency Medicine

## 2020-05-31 ENCOUNTER — Ambulatory Visit: Payer: Self-pay

## 2020-05-31 ENCOUNTER — Ambulatory Visit (INDEPENDENT_AMBULATORY_CARE_PROVIDER_SITE_OTHER): Payer: BC Managed Care – PPO

## 2020-05-31 DIAGNOSIS — R509 Fever, unspecified: Secondary | ICD-10-CM | POA: Diagnosis not present

## 2020-05-31 DIAGNOSIS — R059 Cough, unspecified: Secondary | ICD-10-CM | POA: Insufficient documentation

## 2020-05-31 DIAGNOSIS — R9389 Abnormal findings on diagnostic imaging of other specified body structures: Secondary | ICD-10-CM | POA: Insufficient documentation

## 2020-05-31 DIAGNOSIS — Z79899 Other long term (current) drug therapy: Secondary | ICD-10-CM | POA: Insufficient documentation

## 2020-05-31 DIAGNOSIS — Z7982 Long term (current) use of aspirin: Secondary | ICD-10-CM | POA: Diagnosis not present

## 2020-05-31 DIAGNOSIS — R030 Elevated blood-pressure reading, without diagnosis of hypertension: Secondary | ICD-10-CM | POA: Diagnosis not present

## 2020-05-31 DIAGNOSIS — Z20822 Contact with and (suspected) exposure to covid-19: Secondary | ICD-10-CM | POA: Diagnosis not present

## 2020-05-31 DIAGNOSIS — B349 Viral infection, unspecified: Secondary | ICD-10-CM | POA: Insufficient documentation

## 2020-05-31 DIAGNOSIS — F419 Anxiety disorder, unspecified: Secondary | ICD-10-CM | POA: Diagnosis not present

## 2020-05-31 DIAGNOSIS — R0981 Nasal congestion: Secondary | ICD-10-CM | POA: Insufficient documentation

## 2020-05-31 DIAGNOSIS — R5383 Other fatigue: Secondary | ICD-10-CM | POA: Insufficient documentation

## 2020-05-31 LAB — RESP PANEL BY RT-PCR (FLU A&B, COVID) ARPGX2
Influenza A by PCR: NEGATIVE
Influenza B by PCR: NEGATIVE
SARS Coronavirus 2 by RT PCR: NEGATIVE

## 2020-05-31 MED ORDER — BENZONATATE 100 MG PO CAPS: 200.0000 mg | ORAL_CAPSULE | Freq: Three times a day (TID) | ORAL | 0 refills | Status: AC | PRN

## 2020-05-31 NOTE — ED Provider Notes (Signed)
[facility]    CSN: [id number] Arrival date & time: 05/31/20  0803      History   Chief Complaint Chief Complaint  Patient presents with  . Cough    HPI Madison Preston is a 49 y.o. female   presenting for onset of fever up to 100.5 F and cough that is occasionally been productive yesterday patient also admits to increased fatigue.  She states that she was treated for acute sinusitis on 05/18/2020 with a 7-day course of Augmentin.  Patient states she completed the course of antibiotics.  She states that she was feeling better but had minimal residual nasal congestion.  Denies any fevers until yesterday.  Has been taking Coricidin HBP for symptoms, but no other medications.  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  Patient states that she had 4 - COVID-19 test with the last test about 9 days ago.  Patient denies any ear pain, sinus pain or pressure, sore throat, chest pain, abdominal pain, nausea/vomiting/diarrhea, or change in smell and taste.  Past medical history significant for seasonal allergies and anxiety managed with Zoloft.  She has no other complaints or concerns today.  HPI  Past Medical History:  Diagnosis Date  . Anxiety   . Patent ductus arteriosus     There are no problems to display for this patient.   Past Surgical History:  Procedure Laterality Date  . NO PAST SURGERIES      OB History   No obstetric history on file.      Home Medications    Prior to Admission medications   Medication Sig Start Date End Date Taking? Authorizing Provider  aspirin 81 MG tablet Take 81 mg by mouth daily.   Yes [provider]  levonorgestrel (MIRENA) 20 MCG/24HR IUD 1 each by Intrauterine route once.   Yes [provider]  loratadine (CLARITIN) 10 MG tablet Take 10 mg by mouth daily.   Yes [provider]  sertraline (ZOLOFT) 100 MG tablet Take 100 mg by mouth  daily.   Yes [provider]  benzonatate (TESSALON) 100 MG capsule Take 2 capsules (200 mg total) by mouth 3 (three) times daily as needed for up to 7 days for cough. 05/31/20 06/07/20  Eusebio Friendly B, PA-C  fluticasone (FLONASE) 50 MCG/ACT nasal spray Place 2 sprays into both nostrils daily. 07/23/17 05/18/20  Domenick Gong, MD    Family History Family History  Problem Relation Age of Onset  . Breast cancer Mother   . Diabetes Father     Social History Social History   Tobacco Use  . Smoking status: Never Smoker  . Smokeless tobacco: Never Used  Vaping Use  . Vaping Use: Never used  Substance Use Topics  . Alcohol use: Yes    Alcohol/week: 1.0 standard drink    Types: 1 Glasses of wine per week    Comment: rare  . Drug use: No     Allergies   Sulfa antibiotics   Review of Systems Review of Systems  Constitutional: Positive for fatigue and fever. Negative for chills and diaphoresis.  HENT: Positive for congestion and rhinorrhea. Negative for ear pain, sinus pressure, sinus pain and sore throat.   Respiratory: Positive for cough and shortness of breath (occasionally when laying down, not currently and not with exertion). Negative for chest tightness and wheezing.   Cardiovascular: Negative for chest pain.  Gastrointestinal: Negative for abdominal pain,  nausea and vomiting.  Musculoskeletal: Negative for arthralgias and myalgias.  Skin: Negative for rash.  Neurological: Positive for headaches. Negative for weakness.  Hematological: Negative for adenopathy.     Physical Exam Triage Vital Signs ED Triage Vitals  Enc Vitals Group     BP 05/31/20 0816 (!) 142/96     Pulse Rate 05/31/20 0816 (!) 117     Resp 05/31/20 0816 14     Temp 05/31/20 0816 99.5 F (37.5 C)     Temp Source 05/31/20 0816 Oral     SpO2 05/31/20 0816 96 %     Weight 05/31/20 0812 240 lb (108.9 kg)     Height 05/31/20 0812 5\' 2"  (1.575 m)     Head Circumference --      Peak Flow  --      Pain Score 05/31/20 0812 2     Pain Loc --      Pain Edu? --      Excl. in GC? --    No data found.  Updated Vital Signs BP (!) 142/96 (BP Location: Left Arm)   Pulse (!) 117   Temp 99.5 F (37.5 C) (Oral)   Resp 14   Ht 5\' 2"  (1.575 m)   Wt 240 lb (108.9 kg)   SpO2 96%   BMI 43.90 kg/m     Physical Exam Vitals and nursing note reviewed.  Constitutional:      General: She is not in acute distress.    Appearance: Normal appearance. She is not ill-appearing or toxic-appearing.  HENT:     Head: Normocephalic and atraumatic.     Nose: Congestion and rhinorrhea present.     Mouth/Throat:     Mouth: Mucous membranes are moist.     Pharynx: Oropharynx is clear.  Eyes:     General: No scleral icterus.       Right eye: No discharge.        Left eye: No discharge.     Conjunctiva/sclera: Conjunctivae normal.  Cardiovascular:     Rate and Rhythm: Regular rhythm. Tachycardia present.     Heart sounds: Normal heart sounds.  Pulmonary:     Effort: Pulmonary effort is normal. No respiratory distress.     Breath sounds: Normal breath sounds.  Musculoskeletal:     Cervical back: Neck supple.  Skin:    General: Skin is dry.  Neurological:     General: No focal deficit present.     Mental Status: She is alert. Mental status is at baseline.     Motor: No weakness.     Gait: Gait normal.  Psychiatric:        Mood and Affect: Mood normal.        Behavior: Behavior normal.        Thought Content: Thought content normal.      UC Treatments / Results  Labs (all labs ordered are listed, but only abnormal results are displayed) Labs Reviewed  RESP PANEL BY RT-PCR (FLU A&B, COVID) [id number]    EKG   Radiology DG Chest 2 View  Result Date: 05/31/2020 CLINICAL DATA:  Cough and fever. EXAM: CHEST - 2 VIEW COMPARISON:  08/12/2017 FINDINGS: 0824 hours. Repeat PA film shows markedly improved lung volumes. The lungs are clear without focal pneumonia, edema, pneumothorax  or pleural effusion. The cardiopericardial silhouette is within normal limits for size. Prominent right mediastinal contour stable in the interval, potentially related to ascending thoracic aorta. The visualized bony structures of the thorax  show no acute abnormality. IMPRESSION: 1. No acute cardiopulmonary findings. 2. Stable abnormal right mediastinal contour. Interval stability is compatible with benign etiology although aneurysm of the ascending aorta can cause this appearance. CT chest could be used to further evaluate as clinically warranted. Electronically Signed   By: Kennith Center M.D.   On: 05/31/2020 09:09    Procedures Procedures (including critical care time)  Medications Ordered in UC Medications - No data to display  Initial Impression / Assessment and Plan / UC Course  I have reviewed the triage vital signs and the nursing notes.  Pertinent labs & imaging results that were available during my care of the patient were reviewed by me and considered in my medical decision making (see chart for details).   49 year old female presenting for fever and cough for the past day.  Blood pressure slightly elevated 142/96 and pulse elevated at 117 bpm.  Temperature slightly elevated 99.5 F and O2 is 96%.  Respiratory panel obtained.  Symptoms could be due to influenza or COVID-19.  Chest x-ray obtained due to previous symptoms of acute sinusitis followed by new onset fever and cough which is suspicious for community-acquired pneumonia.  Respiratory panel is all negative.  Chest x-ray independently reviewed by me which does not show any infiltrates.  Chest x-ray over read by radiology does not show any acute cardiopulmonary disease, but suggest CT scan for possible aortic aneurysm.  Patient has not symptomatic for this.  Patient advised of chest x-ray findings.  Discussed risks of aortic aneurysm if she has one.  Patient does have history of PFO that has never fully closed.  Last echo was 15  years ago.  Denies any history of aneurysm to her knowledge.  Patient is concerned with this finding and agrees to proceed to emergency department this time for CT of chest.  Advised supportive care with increasing rest and fluids.  Advised to continue Coricidin HBP.  I have also sent benzonatate if needed.  ED precautions discussed with patient.  Final Clinical Impressions(s) / UC Diagnoses   Final diagnoses:  Viral illness  Cough  Abnormal finding on chest xray     Discharge Instructions     The respiratory panel is negative for influenza and COVID-19.  As discussed, there was an abnormal finding on your chest x-ray, which time not sure if is related to your symptoms or not.  There is a possible aortic aneurysm that would be best evaluated through a CT scan.  You have decided to proceed to the ED at this time for evaluation.  I suspect you have a viral illness.  I have sent Tessalon Perles.  Encouraged rest and increasing fluid intake.  If for some reason you are unable to get the CT scan today, these call your PCP as soon as possible to schedule an outpatient CT scan.  Go to the emergency department if you have any chest pain, dizziness, weakness, breathing difficulty, or any other emergent symptoms.    ED Prescriptions    Medication Sig Dispense Auth. Provider   benzonatate (TESSALON) 100 MG capsule Take 2 capsules (200 mg total) by mouth 3 (three) times daily as needed for up to 7 days for cough. 21 capsule Shirlee Latch, PA-C     PDMP not reviewed this encounter.   Shirlee Latch, PA-C 05/31/20 [phone]

## 2020-05-31 NOTE — ED Triage Notes (Signed)
Patient states that she was treated for a sinus infection on 05/18/20.  Patient states that she finished her antibiotic on Monday.  Patient states that she developed a cough and fever yesterday.

## 2020-05-31 NOTE — Discharge Instructions (Signed)
The respiratory panel is negative for influenza and COVID-19.  As discussed, there was an abnormal finding on your chest x-ray, which time not sure if is related to your symptoms or not.  There is a possible aortic aneurysm that would be best evaluated through a CT scan.  You have decided to proceed to the ED at this time for evaluation.  I suspect you have a viral illness.  I have sent Tessalon Perles.  Encouraged rest and increasing fluid intake.  If for some reason you are unable to get the CT scan today, these call your PCP as soon as possible to schedule an outpatient CT scan.  Go to the emergency department if you have any chest pain, dizziness, weakness, breathing difficulty, or any other emergent symptoms.

## 2020-05-31 NOTE — ED Notes (Signed)
Patient is being discharged from the Urgent Care and sent to the [facility] Emergency Department via private vehicle . Per Curley Spice, PA, patient is in need of higher level of care due to abnormal chest x-ray and needs a CT Scan. Patient is aware and verbalizes understanding of plan of care.  Vitals:   05/31/20 0816  BP: (!) 142/96  Pulse: (!) 117  Resp: 14  Temp: 99.5 F (37.5 C)  SpO2: 96%

## 2020-06-03 ENCOUNTER — Other Ambulatory Visit: Payer: Self-pay

## 2020-06-03 ENCOUNTER — Ambulatory Visit
Admission: EM | Admit: 2020-06-03 | Discharge: 2020-06-03 | Disposition: A | Discharge status: Home / Self Care | Payer: BC Managed Care – PPO | Attending: Emergency Medicine | Admitting: Emergency Medicine

## 2020-06-03 ENCOUNTER — Telehealth: Payer: Self-pay | Admitting: Emergency Medicine

## 2020-06-03 DIAGNOSIS — J189 Pneumonia, unspecified organism: Secondary | ICD-10-CM

## 2020-06-03 MED ORDER — ALBUTEROL SULFATE HFA 108 (90 BASE) MCG/ACT IN AERS: 2.0000 | INHALATION_SPRAY | RESPIRATORY_TRACT | 0 refills | Status: AC | PRN

## 2020-06-03 MED ORDER — AEROCHAMBER MV MISC: 2 refills | Status: AC

## 2020-06-03 MED ORDER — PROMETHAZINE-DM 6.25-15 MG/5ML PO SYRP: 5.0000 mL | ORAL_SOLUTION | Freq: Four times a day (QID) | ORAL | 0 refills | Status: DC | PRN

## 2020-06-03 MED ORDER — PREDNISONE 50 MG PO TABS: ORAL_TABLET | ORAL | 0 refills | Status: DC

## 2020-06-03 MED ORDER — LEVOFLOXACIN 500 MG PO TABS: 500.0000 mg | ORAL_TABLET | Freq: Every day | ORAL | 0 refills | Status: DC

## 2020-06-03 NOTE — Telephone Encounter (Signed)
Patient was prescribed Levaquin for treatment of presumptive pneumonia.  The Levaquin order never made it to the pharmacy.  Order for Levaquin 500 mg daily x7 days resent to pharmacy.

## 2020-06-03 NOTE — Discharge Instructions (Addendum)
Take the Levaquin daily for 7 days.  Use your Tessalon Perles as needed during the day to help you with cough and use the Promethazine DM syrup at bedtime.  This medication will make you drowsy.  Use the albuterol inhaler with the spacer, 2 puffs every 4-6 hours, as needed for shortness of breath and wheezing.  Take the prednisone, 50 mg daily, for 5 days.  If new symptoms develop, or your symptoms worsen, return for reevaluation.  If you develop worsening shortness of breath or worsening dizziness go to the ER for evaluation.

## 2020-06-03 NOTE — ED Triage Notes (Signed)
Pt c/o fever of 102 at approx 0200, 101 this morning at approx 0515. Took Advil 400mg , 650mg  Tylenol at 0515 this morning.  Also reports congestion, runny nose, non-productive cough w/o improvement to cough with tessalon pearls or Mucinex. Intermittent HA. Reports irritated throat after increased coughing the past few days.  Denies n/v/d, abdominal pain, loss of taste/smell. Mild coarse lungs sounds. Has had two COVID vaccines. Oropharynx without erythema.

## 2020-06-03 NOTE — ED Provider Notes (Signed)
[facility]    CSN: [id number] Arrival date & time: 06/03/20  0805      History   Chief Complaint Chief Complaint  Patient presents with   Fever    HPI Madison Preston is a 49 y.o. female.   HPI   49 year old female here for evaluation of fever, cough, and body aches.  Patient states that her symptoms started Christmas Eve.  She was evaluated in this urgent care at that time and had a negative Covid and flu swab.  Patient had a chest x-ray at that time which was negative for pneumonia.  Patient also had a CT that day which was normal.  Patient is complaining of nasal congestion with clear nasal discharge, bilateral ear pain, sore throat that improves with drinking fluids, nonproductive cough but feels a rattle in her chest, shortness of breath, and wheezing.  Patient denies sinus pressure, GI complaints, changes to her sense of taste or smell.  Patient has been vaccinating his Covid but has not received her booster shot.  Patient is not received her flu shot yet this season.  Past Medical History:  Diagnosis Date   Anxiety    Patent ductus arteriosus     There are no problems to display for this patient.   Past Surgical History:  Procedure Laterality Date   NO PAST SURGERIES      OB History   No obstetric history on file.      Home Medications    Prior to Admission medications   Medication Sig Start Date End Date Taking? Authorizing Provider  aspirin 81 MG tablet Take 81 mg by mouth daily.   Yes [provider]  benzonatate (TESSALON) 100 MG capsule Take 2 capsules (200 mg total) by mouth 3 (three) times daily as needed for up to 7 days for cough. 05/31/20 06/07/20 Yes Eusebio Friendly B, PA-C  sertraline (ZOLOFT) 100 MG tablet Take 100 mg by mouth daily.   Yes [provider]  albuterol (VENTOLIN HFA) 108 (90 Base) MCG/ACT inhaler Inhale 2 puffs into the lungs every 4 (four) hours as needed for wheezing or shortness of breath.  06/03/20   Becky Augusta, NP  levonorgestrel (MIRENA) 20 MCG/24HR IUD 1 each by Intrauterine route once.    [provider]  loratadine (CLARITIN) 10 MG tablet Take 10 mg by mouth daily.    [provider]  predniSONE (DELTASONE) 50 MG tablet Take one tablet daily x 5 days 06/03/20   Becky Augusta, NP  promethazine-dextromethorphan (PROMETHAZINE-DM) 6.25-15 MG/5ML syrup Take 5 mLs by mouth 4 (four) times daily as needed. 06/03/20   Becky Augusta, NP  Spacer/Aero-Holding Chambers (AEROCHAMBER MV) inhaler Use as instructed 06/03/20   Becky Augusta, NP  fluticasone [facility]) 50 MCG/ACT nasal spray Place 2 sprays into both nostrils daily. 07/23/17 05/18/20  Domenick Gong, MD    Family History Family History  Problem Relation Age of Onset   Breast cancer Mother    Diabetes Father     Social History Social History   Tobacco Use   Smoking status: Never Smoker   Smokeless tobacco: Never Used  Vaping Use   Vaping Use: Never used  Substance Use Topics   Alcohol use: Yes    Alcohol/week: 1.0 standard drink    Types: 1 Glasses of wine per week    Comment: rare   Drug use: No     Allergies   Sulfa antibiotics   Review of Systems Review of Systems  Constitutional: Positive  for fatigue and fever. Negative for activity change.  HENT: Positive for congestion, ear pain, rhinorrhea and sore throat. Negative for sinus pressure and sinus pain.   Respiratory: Positive for cough, chest tightness, shortness of breath and wheezing.   Cardiovascular: Negative for chest pain.  Gastrointestinal: Negative for diarrhea and nausea.  Musculoskeletal: Positive for arthralgias and myalgias.  Skin: Negative for rash.  Neurological: Positive for dizziness. Negative for syncope.  Hematological: Negative.   Psychiatric/Behavioral: Negative.      Physical Exam Triage Vital Signs ED Triage Vitals  Enc Vitals Group     BP      Pulse      Resp      Temp      Temp src       SpO2      Weight      Height      Head Circumference      Peak Flow      Pain Score      Pain Loc      Pain Edu?      Excl. in GC?    No data found.  Updated Vital Signs BP (!) 118/93 (BP Location: Left Arm)    Pulse (!) 115    Temp (!) 100.8 F (38.2 C) (Oral)    Resp 18    SpO2 100%   Visual Acuity Right Eye Distance:   Left Eye Distance:   Bilateral Distance:    Right Eye Near:   Left Eye Near:    Bilateral Near:     Physical Exam Vitals and nursing note reviewed.  Constitutional:      Appearance: Normal appearance. She is normal weight. She is ill-appearing.  HENT:     Head: Normocephalic and atraumatic.     Right Ear: Tympanic membrane, ear canal and external ear normal. There is no impacted cerumen.     Left Ear: Tympanic membrane, ear canal and external ear normal. There is no impacted cerumen.     Nose: Nose normal.     Mouth/Throat:     Mouth: Mucous membranes are moist.     Pharynx: Oropharynx is clear. No oropharyngeal exudate.  Cardiovascular:     Rate and Rhythm: Normal rate and regular rhythm.     Pulses: Normal pulses.     Heart sounds: Normal heart sounds. No murmur heard. No gallop.   Pulmonary:     Effort: Pulmonary effort is normal.     Breath sounds: Rales present. No wheezing.  Musculoskeletal:     Cervical back: Normal range of motion and neck supple.  Lymphadenopathy:     Cervical: No cervical adenopathy.  Skin:    General: Skin is warm and dry.     Capillary Refill: Capillary refill takes less than 2 seconds.     Findings: No erythema or rash.  Neurological:     General: No focal deficit present.     Mental Status: She is alert and oriented to person, place, and time.  Psychiatric:        Mood and Affect: Mood normal.        Behavior: Behavior normal.        Thought Content: Thought content normal.        Judgment: Judgment normal.      UC Treatments / Results  Labs (all labs ordered are listed, but only abnormal results are  displayed) Labs Reviewed - No data to display  EKG   Radiology No results found.  Procedures  Procedures (including critical care time)  Medications Ordered in UC Medications - No data to display  Initial Impression / Assessment and Plan / UC Course  I have reviewed the triage vital signs and the nursing notes.  Pertinent labs & imaging results that were available during my care of the patient were reviewed by me and considered in my medical decision making (see chart for details).   Patient is here for reevaluation of continued cough, fever, and body aches.  Patient's physical exam reveals coarse Rales in the right lower lobe to auscultation.  The remainder of the lung fields are clear to auscultation.  Patient's upper respiratory tree does not show any inflammation, erythema, or nasal discharge.  No postnasal drip appreciated.  Patient's mucous membranes are sticky and patient is complaining of some mild dizziness.  Suspect mild dehydration due to increased respiratory rate and fever.  Will encourage patient to orally hydrate at home, continue to use Tylenol and ibuprofen for her fever, provide albuterol inhaler and spacer to help with shortness of breath and wheezing, prednisone to help with inflammation, and will treat with a 7-day course of Levaquin.  Suspect patient has developing pneumonia in right lower lobe. Final Clinical Impressions(s) / UC Diagnoses   Final diagnoses:  Pneumonia of right lower lobe due to infectious organism     Discharge Instructions     Take the Levaquin daily for 7 days.  Use your Tessalon Perles as needed during the day to help you with cough and use the Promethazine DM syrup at bedtime.  This medication will make you drowsy.  Use the albuterol inhaler with the spacer, 2 puffs every 4-6 hours, as needed for shortness of breath and wheezing.  Take the prednisone, 50 mg daily, for 5 days.  If new symptoms develop, or your symptoms worsen, return for  reevaluation.  If you develop worsening shortness of breath or worsening dizziness go to the ER for evaluation.    ED Prescriptions    Medication Sig Dispense Auth. Provider   predniSONE (DELTASONE) 50 MG tablet Take one tablet daily x 5 days 5 tablet Becky Augusta, NP   albuterol (VENTOLIN HFA) 108 (90 Base) MCG/ACT inhaler Inhale 2 puffs into the lungs every 4 (four) hours as needed for wheezing or shortness of breath. 18 g Becky Augusta, NP   Spacer/Aero-Holding Chambers (AEROCHAMBER MV) inhaler Use as instructed 1 each Becky Augusta, NP   promethazine-dextromethorphan (PROMETHAZINE-DM) 6.25-15 MG/5ML syrup Take 5 mLs by mouth 4 (four) times daily as needed. 118 mL Becky Augusta, NP     PDMP not reviewed this encounter.   Becky Augusta, NP 06/03/20 [phone]

## 2021-01-10 ENCOUNTER — Other Ambulatory Visit: Payer: Self-pay

## 2021-01-10 ENCOUNTER — Ambulatory Visit
Admission: EM | Admit: 2021-01-10 | Discharge: 2021-01-10 | Disposition: A | Discharge status: Home / Self Care | Payer: BC Managed Care – PPO | Attending: Sports Medicine | Admitting: Sports Medicine

## 2021-01-10 DIAGNOSIS — H6502 Acute serous otitis media, left ear: Secondary | ICD-10-CM

## 2021-01-10 DIAGNOSIS — H9202 Otalgia, left ear: Secondary | ICD-10-CM

## 2021-01-10 MED ORDER — AMOXICILLIN 500 MG PO CAPS: 500.0000 mg | ORAL_CAPSULE | Freq: Three times a day (TID) | ORAL | 0 refills | Status: DC

## 2021-01-10 NOTE — Discharge Instructions (Addendum)
As we discussed, you do have a left ear infection.  I will treat with amoxicillin. Educational handouts provided. Tylenol or Motrin for any discomfort. If your symptoms persist please see your primary care provider. If your symptoms worsen then please seek out care in emergency room setting.

## 2021-01-10 NOTE — ED Provider Notes (Signed)
[facility]    CSN: [id number] Arrival date & time: 01/10/21  1335      History   Chief Complaint Chief Complaint  Patient presents with   Otalgia    left    HPI Madison Preston is a 50 y.o. female.   50 year old female who presents for evaluation of left ear pain for the last 2 days.  She also has a sensation of some facial swelling on the left side.  No chest pain or shortness of breath.  No fever shakes chills.  No nausea vomiting or diarrhea.  No abdominal or urinary symptoms.  No other symptoms of an upper respiratory infection.  No cough, runny nose, chest pain, sore throat, headache, or vision changes.  No nasal congestion.  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  Her primary care physician is Dr. Terance Hart with [facility].  He was unable to see her today.  No red flag signs or symptoms elicited on history.   Past Medical History:  Diagnosis Date   Anxiety    Patent ductus arteriosus     There are no problems to display for this patient.   Past Surgical History:  Procedure Laterality Date   NO PAST SURGERIES      OB History   No obstetric history on file.      Home Medications    Prior to Admission medications   Medication Sig Start Date End Date Taking? Authorizing Provider  albuterol (VENTOLIN HFA) 108 (90 Base) MCG/ACT inhaler Inhale 2 puffs into the lungs every 4 (four) hours as needed for wheezing or shortness of breath. 06/03/20  Yes Becky Augusta, NP  amoxicillin (AMOXIL) 500 MG capsule Take 1 capsule (500 mg total) by mouth 3 (three) times daily. 01/10/21  Yes Delton See, MD  aspirin 81 MG tablet Take 81 mg by mouth daily.   Yes [provider]  fluticasone (FLONASE) 50 MCG/ACT nasal spray SPRAY 2 SPRAYS INTO EACH NOSTRIL EVERY DAY 11/06/20  Yes [provider]  levonorgestrel (MIRENA) 20 MCG/24HR IUD 1 each by Intrauterine route once.   Yes [provider]   loratadine (CLARITIN) 10 MG tablet Take 10 mg by mouth daily.   Yes [provider]  sertraline (ZOLOFT) 100 MG tablet Take 100 mg by mouth daily.   Yes [provider]  levofloxacin (LEVAQUIN) 500 MG tablet Take 1 tablet (500 mg total) by mouth daily. 06/03/20   Becky Augusta, NP  predniSONE (DELTASONE) 50 MG tablet Take one tablet daily x 5 days 06/03/20   Becky Augusta, NP  promethazine-dextromethorphan (PROMETHAZINE-DM) 6.25-15 MG/5ML syrup Take 5 mLs by mouth 4 (four) times daily as needed. 06/03/20   Becky Augusta, NP  Spacer/Aero-Holding Deretha Emory (AEROCHAMBER MV) inhaler Use as instructed 06/03/20   Becky Augusta, NP    Family History Family History  Problem Relation Age of Onset   Breast cancer Mother    Diabetes Father     Social History Social History   Tobacco Use   Smoking status: Never   Smokeless tobacco: Never  Vaping Use   Vaping Use: Never used  Substance Use Topics   Alcohol use: Yes    Alcohol/week: 1.0 standard drink    Types: 1 Glasses of wine per week    Comment: rare   Drug use: No     Allergies   Sulfa antibiotics   Review of Systems Review of Systems  Constitutional:  Negative for  activity change, appetite change, chills, diaphoresis, fatigue and fever.  HENT:  Positive for ear pain and facial swelling. Negative for congestion, ear discharge, postnasal drip, rhinorrhea, sinus pressure, sinus pain, sneezing and sore throat.   Eyes:  Negative for pain and visual disturbance.  Respiratory:  Negative for cough, chest tightness and shortness of breath.   Cardiovascular:  Negative for chest pain and palpitations.  Gastrointestinal:  Negative for abdominal pain, diarrhea, nausea and vomiting.  Genitourinary:  Negative for dysuria.  Musculoskeletal:  Negative for back pain, myalgias and neck pain.  Skin:  Negative for color change, pallor, rash and wound.  Neurological:  Negative for dizziness, syncope, light-headedness, numbness and  headaches.  All other systems reviewed and are negative.   Physical Exam Triage Vital Signs ED Triage Vitals  Enc Vitals Group     BP 01/10/21 1349 111/71     Pulse Rate 01/10/21 1349 (!) 107     Resp 01/10/21 1349 18     Temp 01/10/21 1349 99.2 F (37.3 C)     Temp Source 01/10/21 1349 Oral     SpO2 01/10/21 1349 94 %     Weight 01/10/21 1347 240 lb (108.9 kg)     Height 01/10/21 1347 5\' 2"  (1.575 m)     Head Circumference --      Peak Flow --      Pain Score 01/10/21 1347 6     Pain Loc --      Pain Edu? --      Excl. in GC? --    No data found.  Updated Vital Signs BP 111/71 (BP Location: Left Arm)   Pulse (!) 107   Temp 99.2 F (37.3 C) (Oral)   Resp 18   Ht 5\' 2"  (1.575 m)   Wt 108.9 kg   SpO2 94%   BMI 43.90 kg/m   Visual Acuity Right Eye Distance:   Left Eye Distance:   Bilateral Distance:    Right Eye Near:   Left Eye Near:    Bilateral Near:     Physical Exam Vitals and nursing note reviewed.  Constitutional:      General: She is not in acute distress.    Appearance: Normal appearance. She is not ill-appearing, toxic-appearing or diaphoretic.  HENT:     Head: Normocephalic and atraumatic.     Right Ear: Tympanic membrane normal.     Left Ear: Tenderness present. No drainage. No foreign body. Tympanic membrane is erythematous.     Nose: Nose normal. No congestion or rhinorrhea.     Mouth/Throat:     Mouth: Mucous membranes are moist.     Pharynx: No oropharyngeal exudate or posterior oropharyngeal erythema.  Eyes:     General: No scleral icterus.       Right eye: No discharge.        Left eye: No discharge.     Extraocular Movements: Extraocular movements intact.     Conjunctiva/sclera: Conjunctivae normal.     Pupils: Pupils are equal, round, and reactive to light.  Cardiovascular:     Rate and Rhythm: Normal rate and regular rhythm.     Pulses: Normal pulses.     Heart sounds: Normal heart sounds. No murmur heard.   No friction rub. No  gallop.  Pulmonary:     Effort: Pulmonary effort is normal.     Breath sounds: Normal breath sounds. No stridor. No wheezing, rhonchi or rales.  Musculoskeletal:     Cervical back: Normal  range of motion and neck supple. No rigidity or tenderness.  Lymphadenopathy:     Cervical: No cervical adenopathy.  Skin:    General: Skin is warm and dry.     Capillary Refill: Capillary refill takes less than 2 seconds.  Neurological:     General: No focal deficit present.     Mental Status: She is alert and oriented to person, place, and time.     UC Treatments / Results  Labs (all labs ordered are listed, but only abnormal results are displayed) Labs Reviewed - No data to display  EKG   Radiology No results found.  Procedures Procedures (including critical care time)  Medications Ordered in UC Medications - No data to display  Initial Impression / Assessment and Plan / UC Course  I have reviewed the triage vital signs and the nursing notes.  Pertinent labs & imaging results that were available during my care of the patient were reviewed by me and considered in my medical decision making (see chart for details).  Clinical impression: 1.  Left ear pain 2.  Serous otitis media of the left ear  Treatment plan: 1.  The findings and treatment plan were discussed in detail with the patient.  Patient was in agreement. 2.  Had a long discussion with the patient I will go ahead and treat her for otitis media.  Sent in a prescription for amoxicillin. 3.  Educational handouts provided. 4.  Tylenol Motrin for any discomfort. 5.  If symptoms persist asked her to seek out the care of her primary care physician. 6.  If symptoms were worse I asked her to go to the ER. 7.  She was stable upon discharge and will follow-up here as needed.    Final Clinical Impressions(s) / UC Diagnoses   Final diagnoses:  Otalgia of left ear  Non-recurrent acute serous otitis media of left ear      Discharge Instructions      As we discussed, you do have a left ear infection.  I will treat with amoxicillin. Educational handouts provided. Tylenol or Motrin for any discomfort. If your symptoms persist please see your primary care provider. If your symptoms worsen then please seek out care in emergency room setting.     ED Prescriptions     Medication Sig Dispense Auth. Provider   amoxicillin (AMOXIL) 500 MG capsule Take 1 capsule (500 mg total) by mouth 3 (three) times daily. 21 capsule Delton See, MD      PDMP not reviewed this encounter.   Delton See, MD 01/10/21 [phone]

## 2021-01-10 NOTE — ED Triage Notes (Signed)
Pt c/o left ear pain and swelling over the past few days. Pt denies any drainage from the ear. Pt denies nasal congestion, cough or other symptoms.

## 2021-11-30 ENCOUNTER — Ambulatory Visit
Admission: RE | Admit: 2021-11-30 | Discharge: 2021-11-30 | Disposition: A | Discharge status: Home / Self Care | Payer: Managed Care, Other (non HMO) | Source: Ambulatory Visit | Attending: Urgent Care | Admitting: Urgent Care

## 2021-11-30 VITALS — BP 131/88 | HR 103 | Temp 99.7°F | Resp 18

## 2021-11-30 DIAGNOSIS — Z20822 Contact with and (suspected) exposure to covid-19: Secondary | ICD-10-CM

## 2021-11-30 DIAGNOSIS — R5081 Fever presenting with conditions classified elsewhere: Secondary | ICD-10-CM | POA: Diagnosis not present

## 2021-11-30 DIAGNOSIS — M791 Myalgia, unspecified site: Secondary | ICD-10-CM | POA: Diagnosis not present

## 2021-11-30 DIAGNOSIS — R21 Rash and other nonspecific skin eruption: Secondary | ICD-10-CM

## 2021-11-30 DIAGNOSIS — S70362A Insect bite (nonvenomous), left thigh, initial encounter: Secondary | ICD-10-CM | POA: Diagnosis not present

## 2021-11-30 DIAGNOSIS — W57XXXA Bitten or stung by nonvenomous insect and other nonvenomous arthropods, initial encounter: Secondary | ICD-10-CM

## 2021-11-30 MED ORDER — DOXYCYCLINE HYCLATE 100 MG PO CAPS: 100.0000 mg | ORAL_CAPSULE | Freq: Two times a day (BID) | ORAL | 0 refills | Status: AC

## 2021-11-30 NOTE — ED Triage Notes (Signed)
Pt c/o HA, fever, bodyaches, chills x 3 days. Pt states she brushed a tic off of her leg last week. She has what appears to be a big bite on her left thigh where she brushed the tic off. She also noticed bumps on her chest and arms today.

## 2021-12-03 LAB — COVID-19, FLU A+B NAA
Influenza A, NAA: NOT DETECTED
Influenza B, NAA: NOT DETECTED
SARS-CoV-2, NAA: NOT DETECTED

## 2021-12-18 ENCOUNTER — Other Ambulatory Visit: Payer: Self-pay | Admitting: Family Medicine

## 2021-12-18 DIAGNOSIS — Z1231 Encounter for screening mammogram for malignant neoplasm of breast: Secondary | ICD-10-CM

## 2022-01-08 ENCOUNTER — Ambulatory Visit
Admission: RE | Admit: 2022-01-08 | Discharge: 2022-01-08 | Disposition: A | Discharge status: Home / Self Care | Payer: Managed Care, Other (non HMO) | Source: Ambulatory Visit | Attending: Family Medicine | Admitting: Family Medicine

## 2022-01-08 DIAGNOSIS — Z1231 Encounter for screening mammogram for malignant neoplasm of breast: Secondary | ICD-10-CM | POA: Diagnosis present

## 2023-11-15 ENCOUNTER — Other Ambulatory Visit: Payer: Self-pay | Admitting: Family Medicine

## 2023-11-15 DIAGNOSIS — Z1231 Encounter for screening mammogram for malignant neoplasm of breast: Secondary | ICD-10-CM

## 2023-11-18 ENCOUNTER — Ambulatory Visit
Admission: RE | Admit: 2023-11-18 | Discharge: 2023-11-18 | Disposition: A | Discharge status: Home / Self Care | Source: Ambulatory Visit | Attending: Family Medicine | Admitting: Family Medicine

## 2023-11-18 DIAGNOSIS — Z1231 Encounter for screening mammogram for malignant neoplasm of breast: Secondary | ICD-10-CM | POA: Insufficient documentation
